# Patient Record
Sex: Male | Born: 1960 | Race: White | Hispanic: No | Marital: Married | Smoking: Never smoker
Health system: Southern US, Community
[De-identification: ages and names within clinical notes are randomized; demographics above are authoritative.]

## PROBLEM LIST (undated history)

## (undated) DIAGNOSIS — C4491 Basal cell carcinoma of skin, unspecified: Secondary | ICD-10-CM

---

## 2015-09-21 DIAGNOSIS — C4491 Basal cell carcinoma of skin, unspecified: Secondary | ICD-10-CM | POA: Insufficient documentation

## 2015-09-21 DIAGNOSIS — Z860101 Personal history of adenomatous and serrated colon polyps: Secondary | ICD-10-CM | POA: Insufficient documentation

## 2015-09-21 DIAGNOSIS — Z86010 Personal history of colonic polyps: Secondary | ICD-10-CM | POA: Insufficient documentation

## 2018-03-18 DIAGNOSIS — M25562 Pain in left knee: Secondary | ICD-10-CM | POA: Insufficient documentation

## 2018-04-02 ENCOUNTER — Encounter: Payer: Self-pay | Admitting: Family Medicine

## 2018-04-02 ENCOUNTER — Other Ambulatory Visit: Payer: Self-pay | Admitting: Family Medicine

## 2018-04-02 ENCOUNTER — Ambulatory Visit
Admission: RE | Admit: 2018-04-02 | Discharge: 2018-04-02 | Disposition: A | Discharge status: Home / Self Care | Payer: BLUE CROSS/BLUE SHIELD | Source: Ambulatory Visit | Attending: Family Medicine | Admitting: Family Medicine

## 2018-04-02 ENCOUNTER — Ambulatory Visit: Payer: BLUE CROSS/BLUE SHIELD | Admitting: Family Medicine

## 2018-04-02 VITALS — BP 138/96 | Ht 73.0 in | Wt 160.0 lb

## 2018-04-02 DIAGNOSIS — M25562 Pain in left knee: Secondary | ICD-10-CM

## 2018-04-02 NOTE — Addendum Note (Signed)
Addended by: Jolinda Croak E on: 04/02/2018 05:12 PM   Modules accepted: Orders

## 2018-04-02 NOTE — Progress Notes (Signed)
PCP: Davy Pique, MD  Subjective:   HPI: [name] a 58 y.o. male here for left knee pain.  Patient reports he ran a [place]rot on Thanksgiving that was very hilly.  He reports he did well with this but could not walk a couple days after this. At this time and now he had anteromedial left knee pain. He has iced and rested this which helped. He reports having tried prednisone Dosepak which completely resolved his pain. He also did a new years race without a problem and then the next day had fairly severe pain in the same area.  No prior issues with the left knee. No swelling or bruising. Seems to be better on the bike. More painful when he tries to straighten the knee to about 30 degrees but then pain resolves beyond this. No skin changes or numbness. No catching or locking. Pain is currently more of a soreness.  History reviewed. No pertinent past medical history.  No current outpatient medications on file prior to visit.   No current facility-administered medications on file prior to visit.     History reviewed. No pertinent surgical history.  No Known Allergies  Social History   Socioeconomic History  . Marital status: Married    Spouse name: Not on file  . Number of children: Not on file  . Years of education: Not on file  . Highest education level: Not on file  Occupational History  . Not on file  Social Needs  . Financial resource strain: Not on file  . Food insecurity:    Worry: Not on file    Inability: Not on file  . Transportation needs:    Medical: Not on file    Non-medical: Not on file  Tobacco Use  . Smoking status: Not on file  Substance and Sexual Activity  . Alcohol use: Not on file  . Drug use: Not on file  . Sexual activity: Not on file  Lifestyle  . Physical activity:    Days per week: Not on file    Minutes per session: Not on file  . Stress: Not on file  Relationships  . Social connections:    Talks on phone: Not on file   Gets together: Not on file    Attends religious service: Not on file    Active member of club or organization: Not on file    Attends meetings of clubs or organizations: Not on file    Relationship status: Not on file  . Intimate partner violence:    Fear of current or ex partner: Not on file    Emotionally abused: Not on file    Physically abused: Not on file    Forced sexual activity: Not on file  Other Topics Concern  . Not on file  Social History Narrative  . Not on file    History reviewed. No pertinent family history.  BP (!) 138/96   Ht 6\' 1"  (1.854 m)   Wt 160 lb (72.6 kg)   BMI 21.11 kg/m   Review of Systems: See HPI above.     Objective:  Physical Exam:  Gen: NAD, comfortable in exam room  Left knee: Patellar shift from flexion to extension.  VMO atrophy.  No effusion, ecchymoses, other deformity. Mild TTP anterior aspect of medial joint line, less over pes bursa.  No other tenderness. FROM with 5/5 strength flexion and extension, hip abduction. Negative ant/post drawers. Negative valgus/varus testing. Negative lachmans. Negative mcmurrays, apleys, patellar  apprehension, thessalys. NV intact distally.  Right knee: No deformity. FROM with 5/5 strength. No tenderness to palpation. NVI distally.   Assessment & Plan:  1.  Left knee pain: Independently reviewed radiographs and only mild medial arthropathy.  Consistent with combination of patellofemoral syndrome, meniscus contusion, pes bursitis.  He will start physical therapy and home exercise program.  Icing, Aleve twice a day.  We discussed return to running program.  Follow-up in 1 month for reevaluation.  We also discussed consideration of a repeat steroid Dosepak, intra-articular cortisone injection.

## 2018-04-02 NOTE — Patient Instructions (Signed)
Get x-rays at [facility] - this will assess for an osteochondral defect, level of arthritis. We will call you with the results. Your exam indicates a combination of patellofemoral syndrome, meniscus contusion but no tear, pes bursitis. Assuming the x-rays are normal, start physical therapy. Do home exercises on days you don't go to therapy. Icing 15 minutes at a time 3-4 times a day. Aleve 1-2 tabs twice a day with food - typically for 7 days then as needed. Cross train with swimming or cycling. When returning to running you may want to start with walk:jog 1 minute: 1 minute for 10 total minutes on flat surface. Every other day increase from there (I.e. 2:1 WVP:XTGG for 15 minutes; then 3:1 YIR:SWNI for 20 minutes, etc). This is an alternative to starting at 50% of your usual running and increasing by 10% per week. Follow up with me in 1 month for reevaluation.

## 2018-04-17 ENCOUNTER — Telehealth: Payer: Self-pay | Admitting: Family Medicine

## 2018-04-17 MED ORDER — PREDNISONE 10 MG PO TABS: ORAL_TABLET | ORAL | 0 refills | Status: DC

## 2018-04-17 NOTE — Telephone Encounter (Signed)
-----   Message from Renae Fickle sent at 04/16/2018  1:07 PM EST ----- Regarding: FW: med request Contact: [phone]  ----- Message ----- From: Carolyne Littles Sent: 04/16/2018  11:05 AM EST To: Richardean Sale Harrelson Subject: med request                                    Pt is still having L knee pain. He Has been doing his Physical Therapy exercises.He is asking for a dose pack. States it Was discussed at appt. Pharmacy is [facility] in [place] [address]

## 2018-04-17 NOTE — Telephone Encounter (Signed)
Sent in dose pack for him - please let him know.  Thanks!

## 2018-05-22 ENCOUNTER — Ambulatory Visit
Admission: EM | Admit: 2018-05-22 | Discharge: 2018-05-22 | Disposition: A | Discharge status: Home / Self Care | Payer: BLUE CROSS/BLUE SHIELD | Attending: Family Medicine | Admitting: Family Medicine

## 2018-05-22 ENCOUNTER — Encounter: Payer: Self-pay | Admitting: Emergency Medicine

## 2018-05-22 ENCOUNTER — Other Ambulatory Visit: Payer: Self-pay

## 2018-05-22 DIAGNOSIS — J0100 Acute maxillary sinusitis, unspecified: Secondary | ICD-10-CM | POA: Diagnosis not present

## 2018-05-22 MED ORDER — AMOXICILLIN 875 MG PO TABS: 875.0000 mg | ORAL_TABLET | Freq: Two times a day (BID) | ORAL | 0 refills | Status: DC

## 2018-05-22 NOTE — ED Provider Notes (Signed)
[facility]    CSN: [id number] Arrival date & time: 05/22/18  6967     History   Chief Complaint Chief Complaint  Patient presents with  . Cough    APPT  . Nasal Congestion    HPI Larry Gallegos is a 58 y.o. male.   The history is provided by the patient.  Cough  Associated symptoms: headaches   URI  Presenting symptoms: congestion, cough, facial pain and fatigue   Severity:  Moderate Onset quality:  Sudden Duration:  1 week Timing:  Constant Progression:  Unchanged Chronicity:  New Relieved by:  Nothing Ineffective treatments:  OTC medications Associated symptoms: headaches and sinus pain   Risk factors: sick contacts     History reviewed. No pertinent past medical history.  Patient Active Problem List   Diagnosis Date Noted  . Left knee pain 03/18/2018  . Hx of adenomatous colonic polyps 09/21/2015  . Basal cell carcinoma of skin 09/21/2015    History reviewed. No pertinent surgical history.     Home Medications    Prior to Admission medications   Medication Sig Start Date End Date Taking? Authorizing Provider  amoxicillin (AMOXIL) 875 MG tablet Take 1 tablet (875 mg total) by mouth 2 (two) times daily. 05/22/18   Norval Gable, MD  predniSONE (DELTASONE) 10 MG tablet 6 tabs po day 1, 5 tabs po day 2, 4 tabs po day 3, 3 tabs po day 4, 2 tabs po day 5, 1 tab po day 6 04/17/18   Hudnall, Sharyn Lull, MD    Family History History reviewed. No pertinent family history.  Social History Social History   Tobacco Use  . Smoking status: Never Smoker  . Smokeless tobacco: Never Used  Substance Use Topics  . Alcohol use: Not on file  . Drug use: Not on file     Allergies   Patient has no known allergies.   Review of Systems Review of Systems  Constitutional: Positive for fatigue.  HENT: Positive for congestion and sinus pain.   Respiratory: Positive for cough.   Neurological: Positive for headaches.     Physical Exam Triage Vital  Signs ED Triage Vitals  Enc Vitals Group     BP 05/22/18 0846 (!) 150/100     Pulse Rate 05/22/18 0846 78     Resp 05/22/18 0846 18     Temp 05/22/18 0846 99.9 F (37.7 C)     Temp Source 05/22/18 0846 Oral     SpO2 05/22/18 0846 100 %     Weight 05/22/18 0845 165 lb (74.8 kg)     Height 05/22/18 0845 6\' 1"  (1.854 m)     Head Circumference --      Peak Flow --      Pain Score 05/22/18 0845 0     Pain Loc --      Pain Edu? --      Excl. in [place]? --    No data found.  Updated Vital Signs BP (!) 150/100   Pulse 78   Temp 99.9 F (37.7 C) (Oral)   Resp 18   Ht 6\' 1"  (1.854 m)   Wt 74.8 kg   SpO2 100%   BMI 21.77 kg/m   Visual Acuity Right Eye Distance:   Left Eye Distance:   Bilateral Distance:    Right Eye Near:   Left Eye Near:    Bilateral Near:     Physical Exam Vitals signs and nursing note reviewed.  Constitutional:  General: He is not in acute distress.    Appearance: He is well-developed. He is not toxic-appearing or diaphoretic.  HENT:     Head: Normocephalic and atraumatic.     Right Ear: Tympanic membrane, ear canal and external ear normal.     Left Ear: Tympanic membrane, ear canal and external ear normal.     Nose:     Right Sinus: Maxillary sinus tenderness present.     Left Sinus: Maxillary sinus tenderness present.     Mouth/Throat:     Pharynx: Uvula midline. No oropharyngeal exudate.     Tonsils: No tonsillar abscesses.  Eyes:     General: No scleral icterus.       Right eye: No discharge.        Left eye: No discharge.  Neck:     Musculoskeletal: Normal range of motion and neck supple.     Thyroid: No thyromegaly.     Trachea: No tracheal deviation.  Cardiovascular:     Rate and Rhythm: Normal rate and regular rhythm.     Heart sounds: Normal heart sounds.  Pulmonary:     Effort: Pulmonary effort is normal. No respiratory distress.     Breath sounds: Normal breath sounds. No stridor. No wheezing, rhonchi or rales.  Chest:      Chest wall: No tenderness.  Lymphadenopathy:     Cervical: No cervical adenopathy.  Skin:    General: Skin is warm and dry.     Findings: No rash.  Neurological:     Mental Status: He is alert.      UC Treatments / Results  Labs (all labs ordered are listed, but only abnormal results are displayed) Labs Reviewed - No data to display  EKG None  Radiology No results found.  Procedures Procedures (including critical care time)  Medications Ordered in UC Medications - No data to display  Initial Impression / Assessment and Plan / UC Course  I have reviewed the triage vital signs and the nursing notes.  Pertinent labs & imaging results that were available during my care of the patient were reviewed by me and considered in my medical decision making (see chart for details).      Final Clinical Impressions(s) / UC Diagnoses   Final diagnoses:  Acute maxillary sinusitis, recurrence not specified    ED Prescriptions    Medication Sig Dispense Auth. Provider   amoxicillin (AMOXIL) 875 MG tablet Take 1 tablet (875 mg total) by mouth 2 (two) times daily. 20 tablet Norval Gable, MD      1. diagnosis reviewed with patient 2. rx as per orders above; reviewed possible side effects, interactions, risks and benefits  3. Recommend supportive treatment with otc flonase 4. Follow-up prn if symptoms worsen or don't improve  Controlled Substance Prescriptions [place] Controlled Substance Registry consulted? Not Applicable   Norval Gable, MD 05/22/18 [phone]

## 2018-05-22 NOTE — Discharge Instructions (Addendum)
Flonase nasal spray °

## 2018-05-22 NOTE — ED Triage Notes (Signed)
Patient c/o productive cough and congestion that started 6 days ago. Denies fever. Patient has been taking Dayquil, Nyquil and Robitussin for his symptoms.

## 2018-08-13 ENCOUNTER — Other Ambulatory Visit: Payer: Self-pay

## 2018-08-13 ENCOUNTER — Ambulatory Visit: Payer: BLUE CROSS/BLUE SHIELD | Admitting: Family Medicine

## 2018-08-13 ENCOUNTER — Encounter: Payer: Self-pay | Admitting: Family Medicine

## 2018-08-13 VITALS — BP 130/70 | Ht 73.0 in | Wt 160.0 lb

## 2018-08-13 DIAGNOSIS — M25562 Pain in left knee: Secondary | ICD-10-CM

## 2018-08-13 NOTE — Patient Instructions (Signed)
You have patellofemoral syndrome. Avoid painful activities when possible (often deep squats, lunges bother this). Cross train with swimming, cycling with low resistance, elliptical if needed. Straight leg raise, hip side raises, straight leg raises with foot turned outwards 3 sets of 10 once a day. Add ankle weight if these become too easy. Consider formal physical therapy. Continue your inserts and consider wearing these even in your regular shoes. Avoid flat shoes, barefoot walking as much as possible. Icing 15 minutes at a time 3-4 times a day as needed. Continue naproxen as you have been. Running is as tolerated as we discussed - consider focusing on cross training for the next week then easing in with walk/jog program. Follow up with me in 6 weeks.

## 2018-08-13 NOTE — Progress Notes (Signed)
PCP: Davy Pique, MD  Subjective:   HPI: [name] a 58 y.o. male here for left knee pain.  1/7: Patient reports he ran a [place]rot on Thanksgiving that was very hilly.  He reports he did well with this but could not walk a couple days after this. At this time and now he had anteromedial left knee pain. He has iced and rested this which helped. He reports having tried prednisone Dosepak which completely resolved his pain. He also did a new years race without a problem and then the next day had fairly severe pain in the same area.  No prior issues with the left knee. No swelling or bruising. Seems to be better on the bike. More painful when he tries to straighten the knee to about 30 degrees but then pain resolves beyond this. No skin changes or numbness. No catching or locking. Pain is currently more of a soreness.  5/19: Patient reports he was doing well and able to run for a couple months every other day without any problems. Then a week ago he ran and had some pain in anterior left knee. He rested, did biking and did another 3 mile run and didn't feel any worse pain in the knee. However over the last couple days noticed when he went running developed pain again anterior left knee around and below the patella. Has been taking naproxen. Worked with physical therapy previously, doing home exercises. No catching or locking. Does feel/hear a noise in anterior knee. No skin changes, numbness, new injuries.  History reviewed. No pertinent past medical history.  No current outpatient medications on file prior to visit.   No current facility-administered medications on file prior to visit.     History reviewed. No pertinent surgical history.  No Known Allergies  Social History   Socioeconomic History  . Marital status: Married    Spouse name: Not on file  . Number of children: Not on file  . Years of education: Not on file  . Highest education level: Not on file   Occupational History  . Not on file  Social Needs  . Financial resource strain: Not on file  . Food insecurity:    Worry: Not on file    Inability: Not on file  . Transportation needs:    Medical: Not on file    Non-medical: Not on file  Tobacco Use  . Smoking status: Never Smoker  . Smokeless tobacco: Never Used  Substance and Sexual Activity  . Alcohol use: Not on file  . Drug use: Not on file  . Sexual activity: Not on file  Lifestyle  . Physical activity:    Days per week: Not on file    Minutes per session: Not on file  . Stress: Not on file  Relationships  . Social connections:    Talks on phone: Not on file    Gets together: Not on file    Attends religious service: Not on file    Active member of club or organization: Not on file    Attends meetings of clubs or organizations: Not on file    Relationship status: Not on file  . Intimate partner violence:    Fear of current or ex partner: Not on file    Emotionally abused: Not on file    Physically abused: Not on file    Forced sexual activity: Not on file  Other Topics Concern  . Not on file  Social History Narrative  .  Not on file    History reviewed. No pertinent family history.  BP 130/70   Ht 6\' 1"  (1.854 m)   Wt 160 lb (72.6 kg)   BMI 21.11 kg/m   Review of Systems: See HPI above.     Objective:  Physical Exam:  Gen: NAD, comfortable in exam room  Left knee: Patellar shift flexion to extension.  No other gross deformity, ecchymoses, swelling.  VMO atrophy. No TTP currently including patellar tendon, joint lines, post patellar facets. FROM with 5/5 strength. Negative ant/post drawers. Negative valgus/varus testing. Negative lachmans. Negative mcmurrays, apleys, patellar apprehension, thessalys. NV intact distally.  Assessment & Plan:  1.  Left knee pain: radiographs with only mild arthropathy after last visit.  Location of pain, patellar shift consistent with patellofemoral syndrome,  possible fat pad impingement.  Encouraged home exercises, icing, naproxen.  Arch supports.  Consider restarting physical therapy.  F/u in 6 weeks.

## 2019-12-04 ENCOUNTER — Emergency Department
Admission: EM | Admit: 2019-12-04 | Discharge: 2019-12-04 | Disposition: A | Discharge status: Home / Self Care | Payer: BC Managed Care – PPO | Attending: Emergency Medicine | Admitting: Emergency Medicine

## 2019-12-04 ENCOUNTER — Other Ambulatory Visit: Payer: Self-pay

## 2019-12-04 ENCOUNTER — Emergency Department: Payer: BC Managed Care – PPO

## 2019-12-04 ENCOUNTER — Encounter: Payer: Self-pay | Admitting: Emergency Medicine

## 2019-12-04 DIAGNOSIS — R1031 Right lower quadrant pain: Secondary | ICD-10-CM

## 2019-12-04 DIAGNOSIS — R109 Unspecified abdominal pain: Secondary | ICD-10-CM | POA: Diagnosis present

## 2019-12-04 DIAGNOSIS — R1032 Left lower quadrant pain: Secondary | ICD-10-CM | POA: Insufficient documentation

## 2019-12-04 DIAGNOSIS — Z85828 Personal history of other malignant neoplasm of skin: Secondary | ICD-10-CM | POA: Diagnosis not present

## 2019-12-04 DIAGNOSIS — N50811 Right testicular pain: Secondary | ICD-10-CM | POA: Insufficient documentation

## 2019-12-04 HISTORY — DX: Basal cell carcinoma of skin, unspecified: C44.91

## 2019-12-04 LAB — URINALYSIS, COMPLETE (UACMP) WITH MICROSCOPIC
Bacteria, UA: NONE SEEN
Bilirubin Urine: NEGATIVE
Glucose, UA: NEGATIVE mg/dL
Hgb urine dipstick: NEGATIVE
Ketones, ur: 5 mg/dL — AB
Leukocytes,Ua: NEGATIVE
Nitrite: NEGATIVE
Protein, ur: NEGATIVE mg/dL
Specific Gravity, Urine: 1.018 (ref 1.005–1.030)
pH: 5 (ref 5.0–8.0)

## 2019-12-04 LAB — CBC
HCT: 43.9 % (ref 39.0–52.0)
Hemoglobin: 15.3 g/dL (ref 13.0–17.0)
MCH: 33.6 pg (ref 26.0–34.0)
MCHC: 34.9 g/dL (ref 30.0–36.0)
MCV: 96.5 fL (ref 80.0–100.0)
Platelets: 117 10*3/uL — ABNORMAL LOW (ref 150–400)
RBC: 4.55 MIL/uL (ref 4.22–5.81)
RDW: 12 % (ref 11.5–15.5)
WBC: 8.1 10*3/uL (ref 4.0–10.5)
nRBC: 0 % (ref 0.0–0.2)

## 2019-12-04 LAB — COMPREHENSIVE METABOLIC PANEL
ALT: 18 U/L (ref 0–44)
AST: 27 U/L (ref 15–41)
Albumin: 4.7 g/dL (ref 3.5–5.0)
Alkaline Phosphatase: 65 U/L (ref 38–126)
Anion gap: 11 (ref 5–15)
BUN: 13 mg/dL (ref 8–23)
CO2: 26 mmol/L (ref 22–32)
Calcium: 9.6 mg/dL (ref 8.9–10.3)
Chloride: 103 mmol/L (ref 98–111)
Creatinine, Ser: 0.82 mg/dL (ref 0.61–1.24)
GFR calc Af Amer: >60 mL/min (ref >60)
GFR calc non Af Amer: >60 mL/min (ref >60)
Glucose, Bld: 118 mg/dL — ABNORMAL HIGH (ref 70–99)
Potassium: 4.2 mmol/L (ref 3.5–5.1)
Sodium: 140 mmol/L (ref 135–145)
Total Bilirubin: 1.5 mg/dL — ABNORMAL HIGH (ref 0.3–1.2)
Total Protein: 8.1 g/dL (ref 6.5–8.1)

## 2019-12-04 LAB — LIPASE, BLOOD: Lipase: 38 U/L (ref 11–51)

## 2019-12-04 MED ORDER — CEFDINIR 300 MG PO CAPS: 300.0000 mg | ORAL_CAPSULE | Freq: Two times a day (BID) | ORAL | 0 refills | Status: AC

## 2019-12-04 MED ORDER — IOHEXOL 300 MG/ML  SOLN
100.0000 mL | Freq: Once | INTRAMUSCULAR | Status: AC | PRN
  Administered 2019-12-04: 100 mL via INTRAVENOUS
  Filled 2019-12-04: qty 100

## 2019-12-04 MED ORDER — IOHEXOL 9 MG/ML PO SOLN
500.0000 mL | ORAL | Status: AC
  Administered 2019-12-04: 500 mL via ORAL
  Filled 2019-12-04 (×2): qty 500

## 2019-12-04 NOTE — ED Notes (Signed)
Patient declined discharge vital signs. 

## 2019-12-04 NOTE — ED Provider Notes (Signed)
[facility] Emergency Department Provider Note   ____________________________________________   First MD Initiated Contact with Patient 12/04/19 1738     (approximate)  I have reviewed the triage vital signs and the nursing notes.   HISTORY  Chief Complaint Abdominal Pain    HPI Larry Gallegos is a 59 y.o. male with no stated past medical history presents for left lower quadrant abdominal pain and associated left testicular pain that began at approximately 0100 today.  Patient and complains of aching 4/10 right lower quadrant pain that radiates down into his groin and testicle on the right side.  Patient states that elevation of the testicle somewhat improves pain.  Patient states some redness to the right testicle.  Patient denies any history of inguinal hernia.  Patient denies any fevers, nausea/vomiting/diarrhea         Past Medical History:  Diagnosis Date  . Basal cell carcinoma (BCC)     Patient Active Problem List   Diagnosis Date Noted  . Left knee pain 03/18/2018  . Hx of adenomatous colonic polyps 09/21/2015  . Basal cell carcinoma of skin 09/21/2015    History reviewed. No pertinent surgical history.  Prior to Admission medications   Medication Sig Start Date End Date Taking? Authorizing Provider  cefdinir (OMNICEF) 300 MG capsule Take 1 capsule (300 mg total) by mouth 2 (two) times daily for 3 days. 12/04/19 12/07/19  Naaman Plummer, MD    Allergies Patient has no known allergies.  No family history on file.  Social History Social History   Tobacco Use  . Smoking status: Never Smoker  . Smokeless tobacco: Never Used  Vaping Use  . Vaping Use: Never used  Substance Use Topics  . Alcohol use: Yes  . Drug use: Never    Review of Systems Constitutional: No fever/chills Eyes: No visual changes. ENT: No sore throat. Cardiovascular: Denies chest pain. Respiratory: Denies shortness of breath. Gastrointestinal: Endorses  abdominal pain.  No nausea, no vomiting.  No diarrhea. Genitourinary: Negative for dysuria.  Endorses right testicular pain Musculoskeletal: Negative for acute arthralgias Skin: Negative for rash. Neurological: Negative for headaches, weakness/numbness/paresthesias in any extremity Psychiatric: Negative for suicidal ideation/homicidal ideation   ____________________________________________   PHYSICAL EXAM:  VITAL SIGNS: ED Triage Vitals  Enc Vitals Group     BP 12/04/19 1650 (!) 158/90     Pulse Rate 12/04/19 1650 83     Resp 12/04/19 1650 20     Temp 12/04/19 1650 98.5 F (36.9 C)     Temp Source 12/04/19 1650 Oral     SpO2 12/04/19 1650 97 %     Weight 12/04/19 1652 170 lb (77.1 kg)     Height 12/04/19 1652 6\' 1"  (1.854 m)     Head Circumference --      Peak Flow --      Pain Score 12/04/19 1657 7     Pain Loc --      Pain Edu? --      Excl. in [place]? --    Constitutional: Alert and oriented. Well appearing and in no acute distress. Eyes: Conjunctivae are normal. PERRL. EOMI. Head: Atraumatic. Nose: No congestion/rhinnorhea. Mouth/Throat: Mucous membranes are moist.  Oropharynx non-erythematous. Neck: No stridor Cardiovascular: Normal rate, regular rhythm. Grossly normal heart sounds.  Good peripheral circulation. Respiratory: Normal respiratory effort.  No retractions. Lungs CTAB. Gastrointestinal: Mild tenderness to palpation over right inguinal region. No distention. No abdominal bruits. No CVA tenderness. GU: Normal circumcised male genitalia  with mild erythema and tenderness to palpation over right testicle and epididymis Musculoskeletal: No lower extremity tenderness nor edema.  No joint effusions. Neurologic:  Normal speech and language. No gross focal neurologic deficits are appreciated. No gait instability. Skin:  Skin is warm, dry and intact. No rash noted. Psychiatric: Mood and affect are normal. Speech and behavior are  normal.  ____________________________________________   LABS (all labs ordered are listed, but only abnormal results are displayed)  Labs Reviewed  COMPREHENSIVE METABOLIC PANEL - Abnormal; Notable for the following components:      Result Value   Glucose, Bld 118 (*)    Total Bilirubin 1.5 (*)    All other components within normal limits  CBC - Abnormal; Notable for the following components:   Platelets 117 (*)    All other components within normal limits  URINALYSIS, COMPLETE (UACMP) WITH MICROSCOPIC - Abnormal; Notable for the following components:   Color, Urine YELLOW (*)    APPearance CLEAR (*)    Ketones, ur 5 (*)    All other components within normal limits  LIPASE, BLOOD   ____________________________________________ ____________________________________________  RADIOLOGY  ED MD interpretation: CT of the abdomen and pelvis with oral and IV contrast show vascular prominence within the right inguinal canal compared to the left concerning for inflammation around the inguinal canal and possible right testicle  Official radiology report(s): CT Abdomen Pelvis W Contrast  Result Date: 12/04/2019 CLINICAL DATA:  RIGHT lower quadrant abdominal pain. Concern for appendicitis. EXAM: CT ABDOMEN AND PELVIS WITH CONTRAST TECHNIQUE: Multidetector CT imaging of the abdomen and pelvis was performed using the standard protocol following bolus administration of intravenous contrast. CONTRAST:  147mL OMNIPAQUE IOHEXOL 300 MG/ML  SOLN COMPARISON:  None. FINDINGS: Lower chest: Lung bases are clear. Hepatobiliary: No focal hepatic lesion. No biliary duct dilatation. Common bile duct is normal. Pancreas: Pancreas is normal. No ductal dilatation. No pancreatic inflammation. Spleen: Normal spleen Adrenals/urinary tract: Adrenal glands and kidneys are normal. The ureters and bladder normal. Stomach/Bowel: Stomach, small bowel, appendix, and cecum are normal. Normal appendix identified on coronal image  53/5. Cecum and terminal ileum normal. Stool in the ascending colon. The transverse colon and descending colon are gas-filled. Rectosigmoid colon gas-filled. No obstruction. No inflammation. Vascular/Lymphatic: Abdominal aorta is normal caliber. No periportal or retroperitoneal adenopathy. No pelvic adenopathy. Reproductive: Prostate normal. Other: No inguinal hernia or ventral hernia. Increased vascular prominence within the RIGHT inguinal canal compared to the LEFT (image 84/2). Musculoskeletal: No aggressive osseous lesion. IMPRESSION: 1. Normal appendix. 2. Mild hyperemia within the RIGHT inguinal canal. Recommend clinical correlation with RIGHT testicular inflammation. Electronically Signed   By: Suzy Bouchard M.D.   On: 12/04/2019 20:06    ____________________________________________   PROCEDURES  Procedure(s) performed (including Critical Care):  Procedures   ____________________________________________   INITIAL IMPRESSION / ASSESSMENT AND PLAN / ED COURSE        Patient presents for right inguinal and testicular pain.  Differential diagnosis includes orchitis, epididymitis, appendicitis, or incarcerated inguinal hernia.  Based on patient's history, physical exam, radiologic laboratory evaluation, patient seems to likely be suffering from orchitis/epididymitis.  Patient's urinalysis did not show any evidence of acute UTI.  Patient denies any new or unprotected sexual intercourse.  Therefore patient will be treated with a short course of antibiotics and instructions for supportive underwear for this testicular pain as well as over-the-counter medications.  Patient agrees with plan for discharge and follow-up with his primary care physician in the next 5/7 days if  symptoms do not improve.      ____________________________________________   FINAL CLINICAL IMPRESSION(S) / ED DIAGNOSES  Final diagnoses:  Right inguinal pain  Right testicular pain     ED Discharge Orders          Ordered    cefdinir (OMNICEF) 300 MG capsule  2 times daily        12/04/19 2021           Note:  This document was prepared using Dragon voice recognition software and may include unintentional dictation errors.   Naaman Plummer, MD 12/04/19 2026

## 2019-12-04 NOTE — ED Triage Notes (Signed)
Pt in via POV, acute onset RLQ abdominal pain since 0100 today; sent over from [facility] for rule out appendicitis.  Denies N/V/D.  Vitals WDL, NAD noted.

## 2020-01-09 ENCOUNTER — Emergency Department
Admission: EM | Admit: 2020-01-09 | Discharge: 2020-01-09 | Disposition: A | Discharge status: Other Acute Inpatient Hospital | Payer: BC Managed Care – PPO | Attending: Emergency Medicine | Admitting: Emergency Medicine

## 2020-01-09 ENCOUNTER — Encounter: Payer: Self-pay | Admitting: Emergency Medicine

## 2020-01-09 ENCOUNTER — Emergency Department: Payer: BC Managed Care – PPO

## 2020-01-09 ENCOUNTER — Other Ambulatory Visit: Payer: Self-pay

## 2020-01-09 DIAGNOSIS — R42 Dizziness and giddiness: Secondary | ICD-10-CM | POA: Diagnosis present

## 2020-01-09 DIAGNOSIS — R202 Paresthesia of skin: Secondary | ICD-10-CM | POA: Diagnosis not present

## 2020-01-09 DIAGNOSIS — I609 Nontraumatic subarachnoid hemorrhage, unspecified: Secondary | ICD-10-CM | POA: Diagnosis not present

## 2020-01-09 DIAGNOSIS — Z20822 Contact with and (suspected) exposure to covid-19: Secondary | ICD-10-CM | POA: Diagnosis not present

## 2020-01-09 DIAGNOSIS — Z85828 Personal history of other malignant neoplasm of skin: Secondary | ICD-10-CM | POA: Diagnosis not present

## 2020-01-09 DIAGNOSIS — R519 Headache, unspecified: Secondary | ICD-10-CM

## 2020-01-09 DIAGNOSIS — I607 Nontraumatic subarachnoid hemorrhage from unspecified intracranial artery: Secondary | ICD-10-CM

## 2020-01-09 LAB — COMPREHENSIVE METABOLIC PANEL
ALT: 32 U/L (ref 0–44)
AST: 51 U/L — ABNORMAL HIGH (ref 15–41)
Albumin: 4.5 g/dL (ref 3.5–5.0)
Alkaline Phosphatase: 63 U/L (ref 38–126)
Anion gap: 17 — ABNORMAL HIGH (ref 5–15)
BUN: 10 mg/dL (ref 8–23)
CO2: 19 mmol/L — ABNORMAL LOW (ref 22–32)
Calcium: 9.6 mg/dL (ref 8.9–10.3)
Chloride: 104 mmol/L (ref 98–111)
Creatinine, Ser: 0.97 mg/dL (ref 0.61–1.24)
GFR, Estimated: >60 mL/min (ref >60)
Glucose, Bld: 174 mg/dL — ABNORMAL HIGH (ref 70–99)
Potassium: 3.6 mmol/L (ref 3.5–5.1)
Sodium: 140 mmol/L (ref 135–145)
Total Bilirubin: 1.2 mg/dL (ref 0.3–1.2)
Total Protein: 7.7 g/dL (ref 6.5–8.1)

## 2020-01-09 LAB — CBC WITH DIFFERENTIAL/PLATELET
Abs Immature Granulocytes: 0.03 10*3/uL (ref 0.00–0.07)
Basophils Absolute: 0.1 10*3/uL (ref 0.0–0.1)
Basophils Relative: 1 %
Eosinophils Absolute: 0.3 10*3/uL (ref 0.0–0.5)
Eosinophils Relative: 4 %
HCT: 43.2 % (ref 39.0–52.0)
Hemoglobin: 15.5 g/dL (ref 13.0–17.0)
Immature Granulocytes: 0 %
Lymphocytes Relative: 35 %
Lymphs Abs: 2.5 10*3/uL (ref 0.7–4.0)
MCH: 33.9 pg (ref 26.0–34.0)
MCHC: 35.9 g/dL (ref 30.0–36.0)
MCV: 94.5 fL (ref 80.0–100.0)
Monocytes Absolute: 0.6 10*3/uL (ref 0.1–1.0)
Monocytes Relative: 9 %
Neutro Abs: 3.5 10*3/uL (ref 1.7–7.7)
Neutrophils Relative %: 51 %
Platelets: 111 10*3/uL — ABNORMAL LOW (ref 150–400)
RBC: 4.57 MIL/uL (ref 4.22–5.81)
RDW: 11.9 % (ref 11.5–15.5)
WBC: 6.9 10*3/uL (ref 4.0–10.5)
nRBC: 0 % (ref 0.0–0.2)

## 2020-01-09 LAB — RESPIRATORY PANEL BY RT PCR (FLU A&B, COVID)
Influenza A by PCR: NEGATIVE
Influenza B by PCR: NEGATIVE
SARS Coronavirus 2 by RT PCR: NEGATIVE

## 2020-01-09 LAB — TROPONIN I (HIGH SENSITIVITY): Troponin I (High Sensitivity): 3 ng/L (ref <18)

## 2020-01-09 MED ORDER — IOHEXOL 350 MG/ML SOLN
75.0000 mL | Freq: Once | INTRAVENOUS | Status: AC | PRN
  Administered 2020-01-09: 75 mL via INTRAVENOUS

## 2020-01-09 MED ORDER — LEVETIRACETAM IN NACL 1000 MG/100ML IV SOLN
1000.0000 mg | Freq: Once | INTRAVENOUS | Status: AC
  Administered 2020-01-09: 1000 mg via INTRAVENOUS
  Filled 2020-01-09: qty 100

## 2020-01-09 MED ORDER — FENTANYL CITRATE (PF) 100 MCG/2ML IJ SOLN
50.0000 ug | INTRAMUSCULAR | Status: DC | PRN
  Administered 2020-01-09: 50 ug via INTRAVENOUS
  Filled 2020-01-09: qty 2

## 2020-01-09 MED ORDER — SODIUM CHLORIDE 0.9 % IV BOLUS
1000.0000 mL | Freq: Once | INTRAVENOUS | Status: AC
  Administered 2020-01-09: 1000 mL via INTRAVENOUS

## 2020-01-09 MED ORDER — NICARDIPINE HCL IN NACL 20-0.86 MG/200ML-% IV SOLN
3.0000 mg/h | INTRAVENOUS | Status: DC
  Administered 2020-01-09: 5 mg/h via INTRAVENOUS
  Filled 2020-01-09: qty 200

## 2020-01-09 NOTE — ED Notes (Signed)
EMTALA reviewed by this RN.  
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
## 2020-01-09 NOTE — ED Notes (Signed)
Sandi Carne called with bed assignment for patient  [place], [place] [facility]  [phone] Lorre Nick for report
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
## 2022-03-30 IMAGING — CT CT HEAD W/O CM
3 series · 15 of 47 positions shown, 18 images · non-contrast
Comparison: None.

CLINICAL DATA: Acute headache, vomiting, and dizziness.

EXAM:
CT HEAD WITHOUT CONTRAST
TECHNIQUE: Contiguous axial images were obtained from the base of the skull
through the vertex without intravenous contrast.

[Series 2: head wo · axial · 0.47mm/px · z∈[-126,+4]mm · 9 of 32 slices shown, 12 images]
[im 3/32  brain]
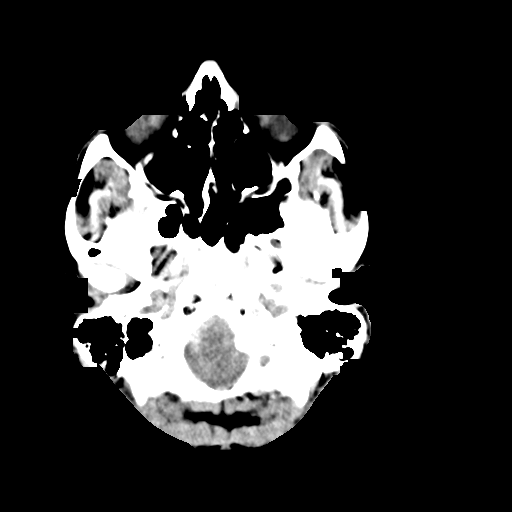
[im 3/32  bone]
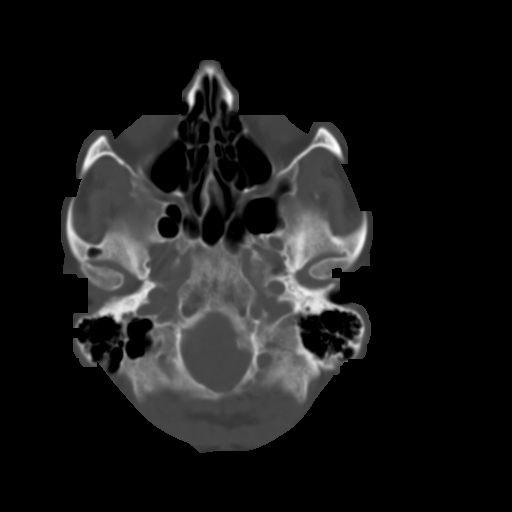
[im 6/32  brain]
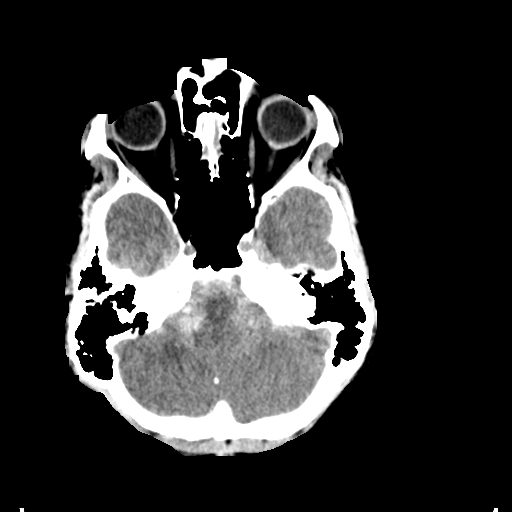
[im 9/32  brain]
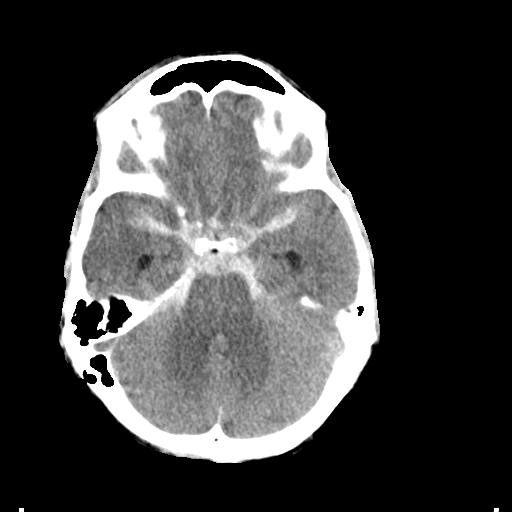
[im 12/32  brain]
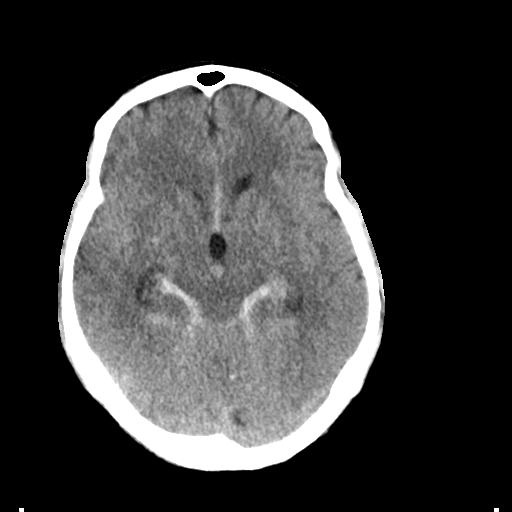
[im 17/32  brain]
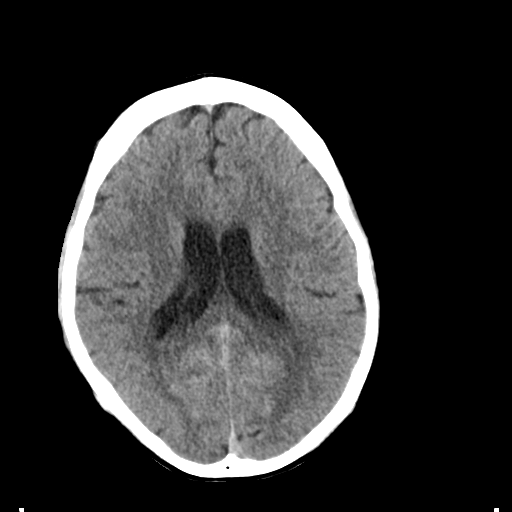
[im 17/32  bone]
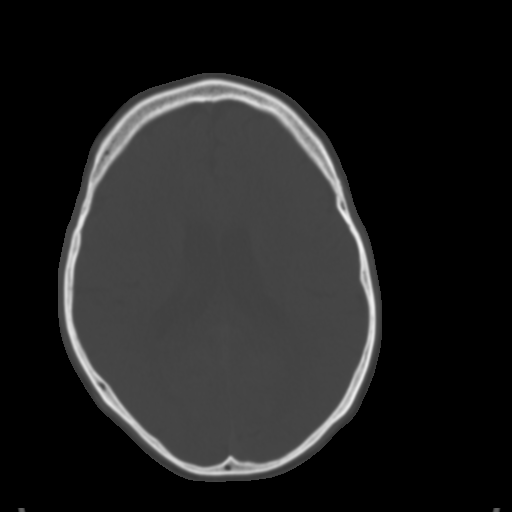
[im 20/32  brain]
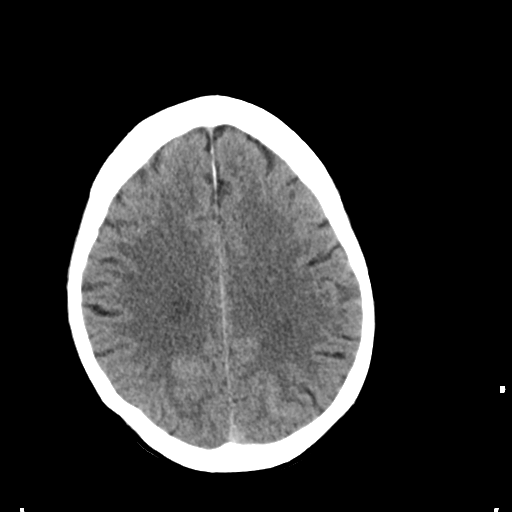
[im 23/32  brain]
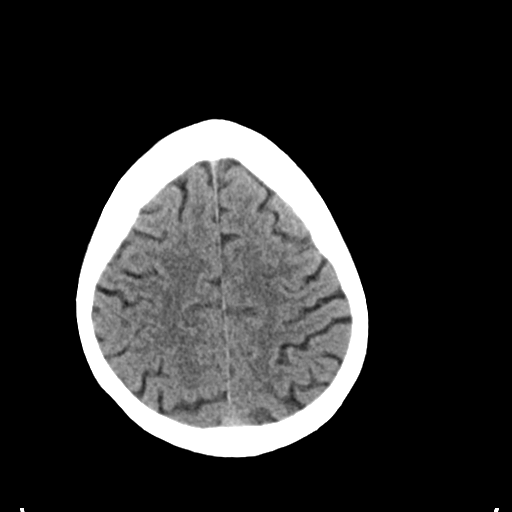
[im 26/32  brain]
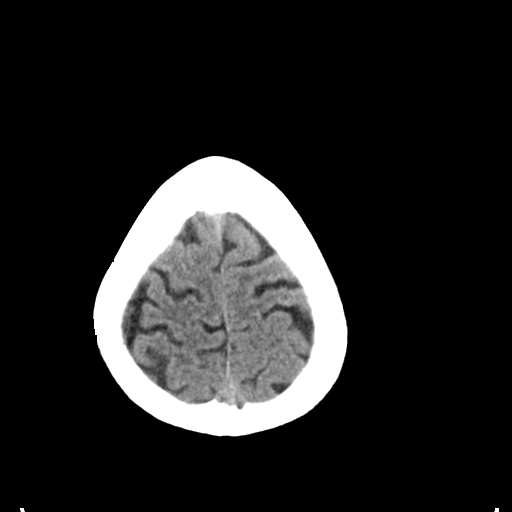
[im 29/32  brain]
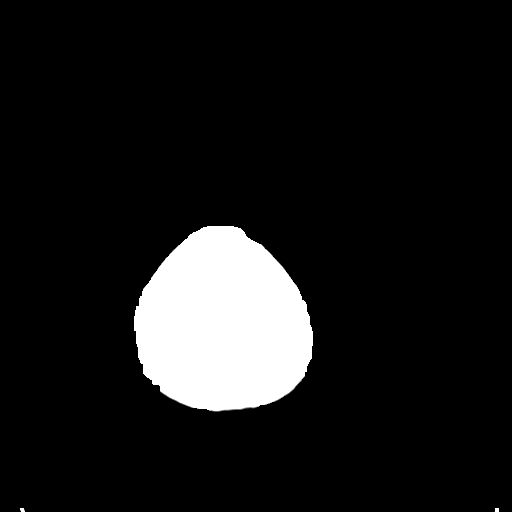
[im 29/32  bone]
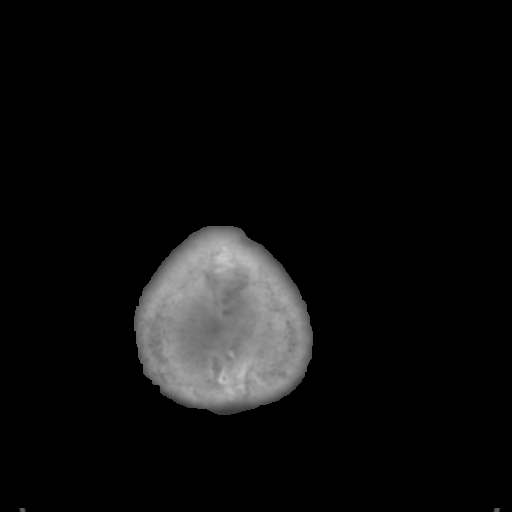

[Series 4: coronal soft tissue · coronal · 0.31mm/px · 3 of 68 slices shown]
[im 23/68  brain]
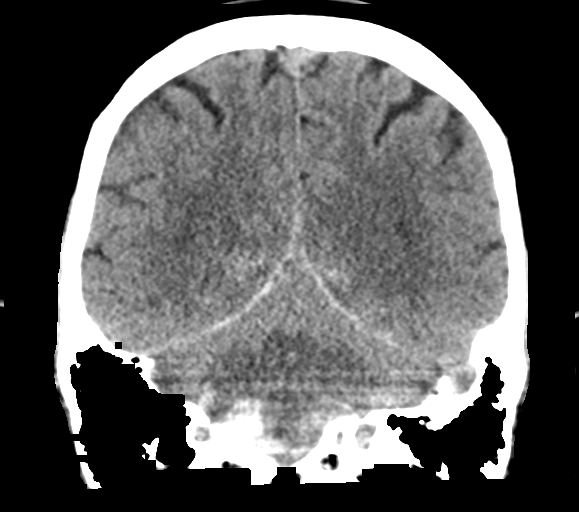
[im 30/68  brain]
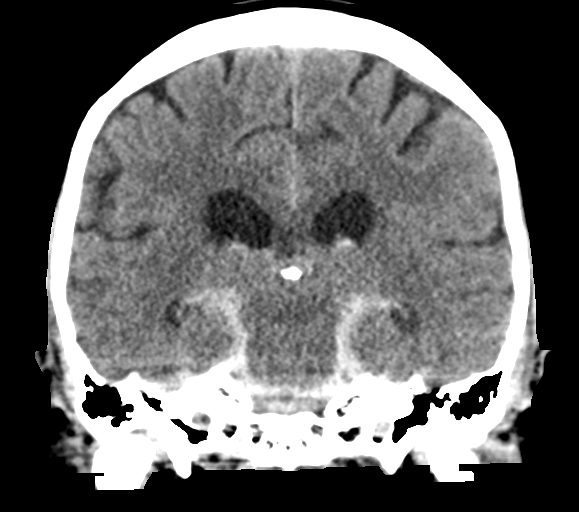
[im 38/68  brain]
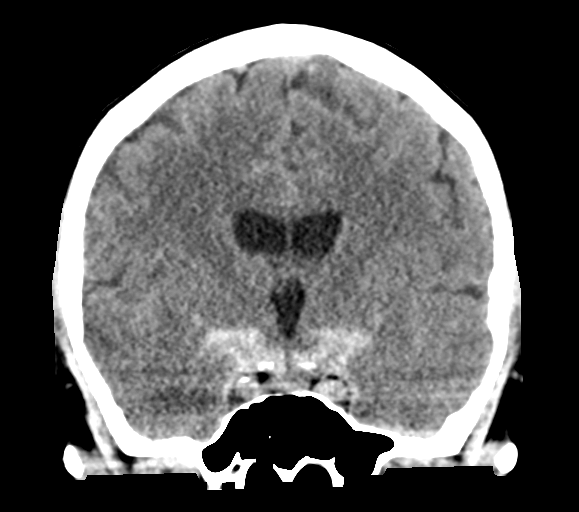

[Series 5: sagittal soft tissue · sagittal · 0.31mm/px · 3 of 60 slices shown]
[im 20/60  brain]
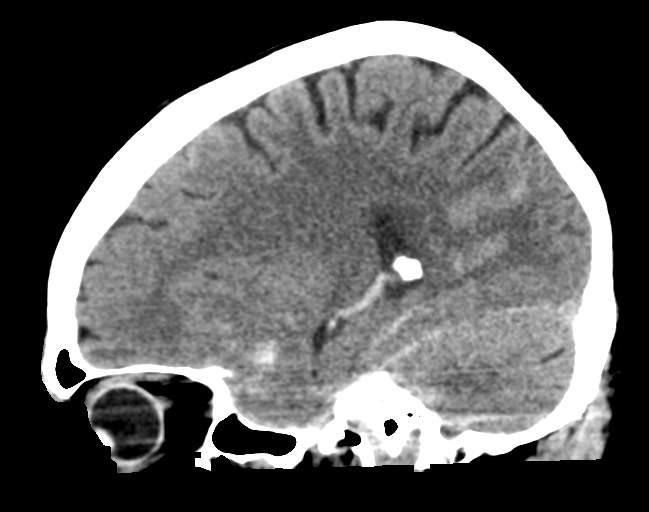
[im 30/60  brain]
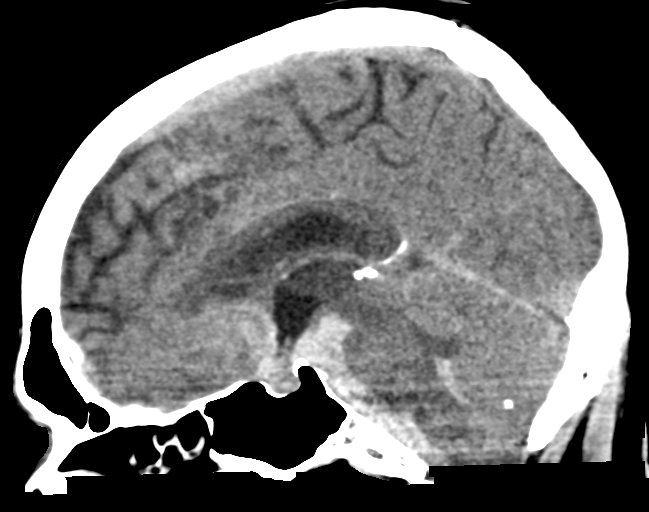
[im 40/60  brain]
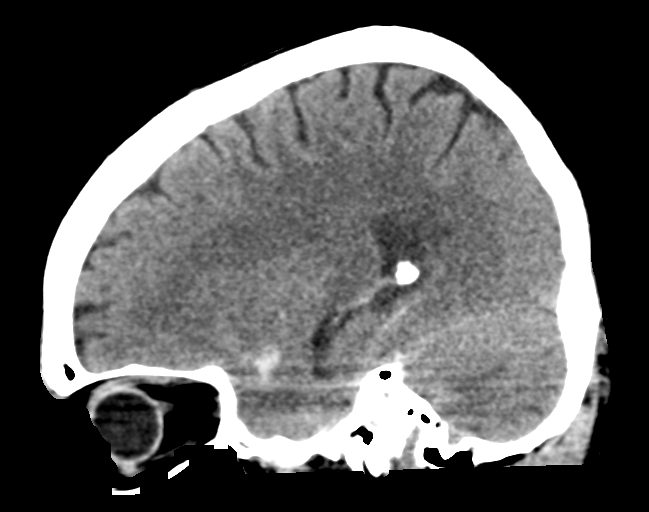

[15 of 47 positions shown; findings below may reference images not displayed]

FINDINGS: Brain: Extensive subarachnoid hemorrhage is present. Hemorrhage
extends throughout the basal cisterns. Interpeduncular notch is
opacified. There is slight asymmetry at the right CP angle and at
the expected tip of the basilar artery.

Mass effect is present with effacement of the sulci temporal lobes.
Mass effect present posterior fossa. Hemorrhage is present in the
fourth ventricles.

Lateral ventricles are mildly dilated, noted at the temporal tips.
Cerebellar tonsils extend to the foramen magnum.

No acute cortical infarct is present.

Vascular: No hyperdense vessel or unexpected calcification.

Skull: Calvarium is intact. No focal lytic or blastic lesions are
present. No significant extracranial soft tissue lesion is present.

Sinuses/Orbits: The paranasal sinuses and mastoid air cells are
clear. The globes and orbits are within normal limits.
IMPRESSION: 1. Diffuse subarachnoid hemorrhage. This is most concerning for
aneurysmal hemorrhage. Recommend CTA of the head and neck for
further evaluation aneurysm.
2. Hemorrhage extends throughout the basal cisterns and into the
fourth ventricle.
3. Evidence of early hydrocephalus with slight dilation the temporal
tips the lateral ventricles.
4. Mass effect with effacement of the sulci of the temporal lobes
bilaterally.
5. Cerebellar tonsils extend to the foramen magnum.
